# Patient Record
Sex: Female | Born: 1955 | Race: White | Hispanic: No | Smoking: Former smoker
Health system: Southern US, Community
[De-identification: ages and names within clinical notes are randomized; demographics above are authoritative.]

## PROBLEM LIST (undated history)

## (undated) DIAGNOSIS — K219 Gastro-esophageal reflux disease without esophagitis: Secondary | ICD-10-CM

## (undated) DIAGNOSIS — E785 Hyperlipidemia, unspecified: Secondary | ICD-10-CM

## (undated) DIAGNOSIS — T7840XA Allergy, unspecified, initial encounter: Secondary | ICD-10-CM

## (undated) DIAGNOSIS — H269 Unspecified cataract: Secondary | ICD-10-CM

## (undated) DIAGNOSIS — B019 Varicella without complication: Secondary | ICD-10-CM

## (undated) HISTORY — DX: Allergy, unspecified, initial encounter: T78.40XA

## (undated) HISTORY — DX: Varicella without complication: B01.9

## (undated) HISTORY — DX: Hyperlipidemia, unspecified: E78.5

## (undated) HISTORY — PX: APPENDECTOMY: SHX54

## (undated) HISTORY — PX: TUBAL LIGATION: SHX77

## (undated) HISTORY — PX: TONSILLECTOMY: SUR1361

---

## 2008-10-18 ENCOUNTER — Emergency Department (HOSPITAL_COMMUNITY): Admission: EM | Admit: 2008-10-18 | Discharge: 2008-10-18 | Payer: Self-pay | Admitting: Emergency Medicine

## 2009-10-03 IMAGING — CR DG FOOT COMPLETE 3+V*R*
3 series · 3 of 3 positions shown · non-contrast
Comparison: None

CLINICAL DATA: Right foot injury

RIGHT FOOT COMPLETE - 3+ VIEW

[view not recorded (1 of 3)]
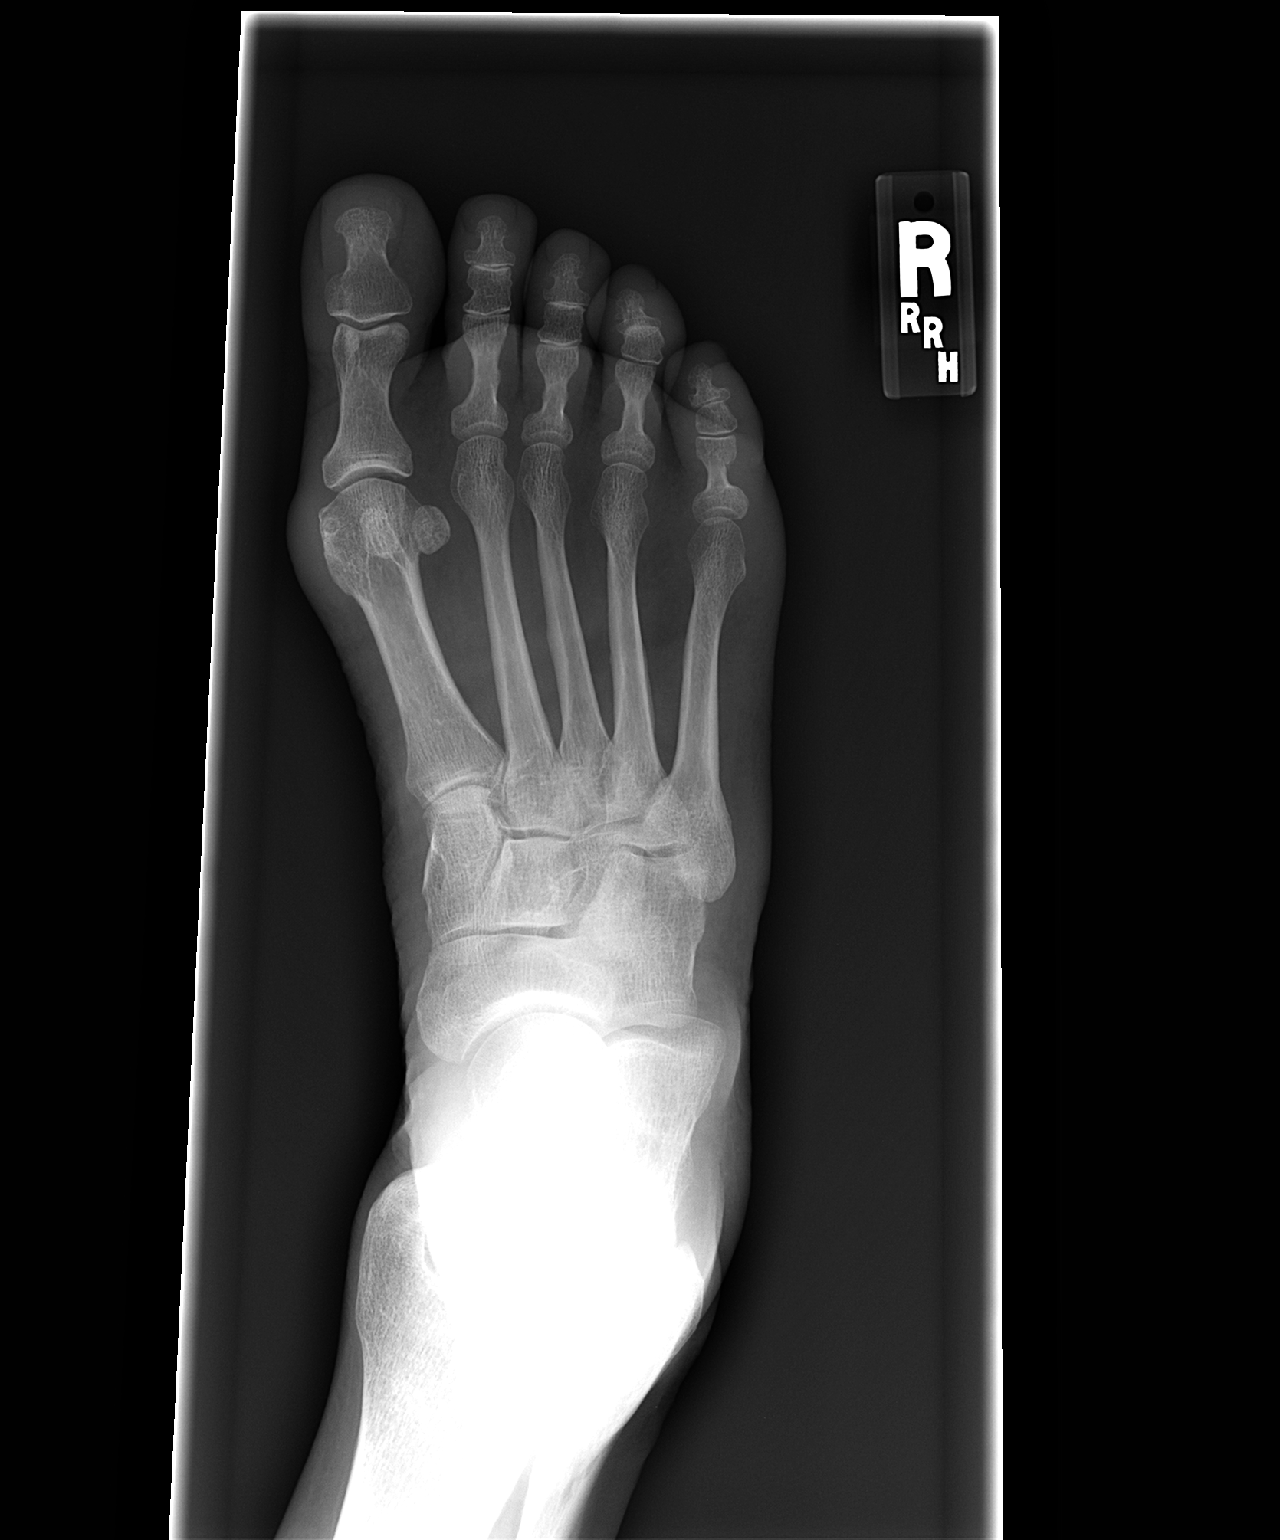

[view not recorded (2 of 3)]
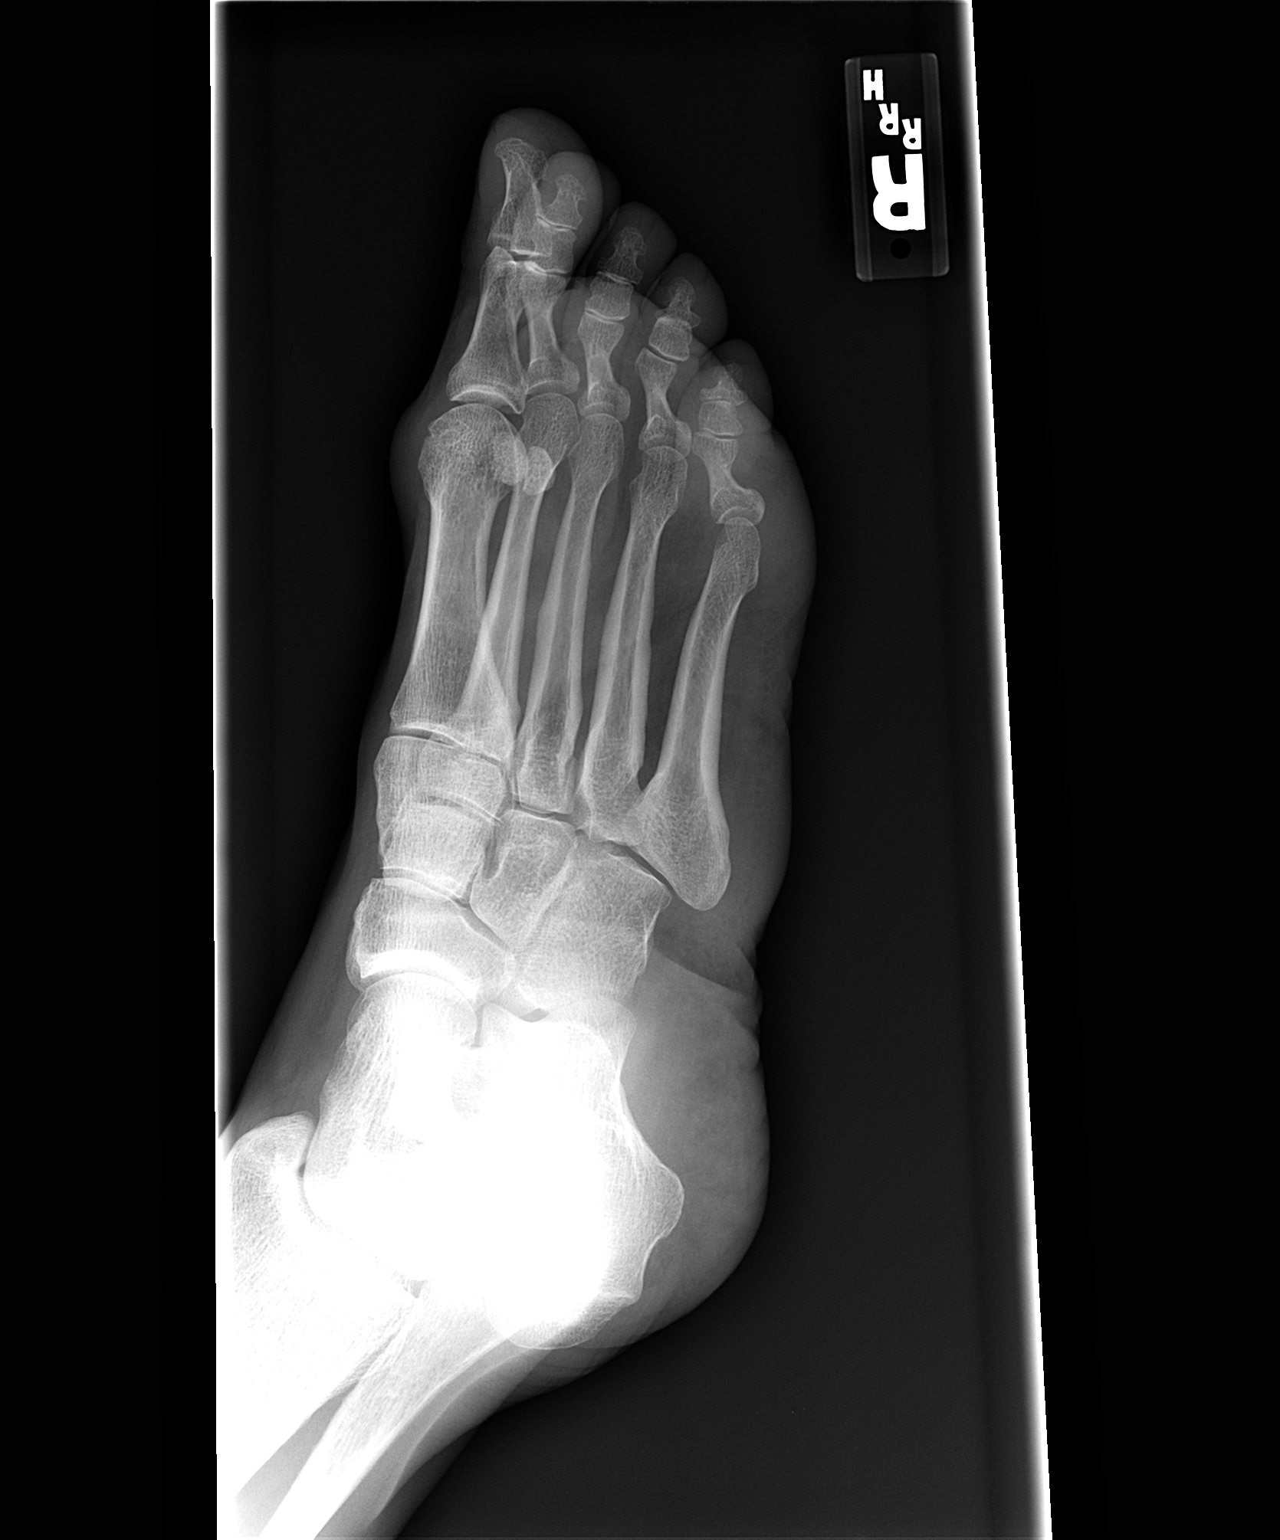

[view not recorded (3 of 3)]
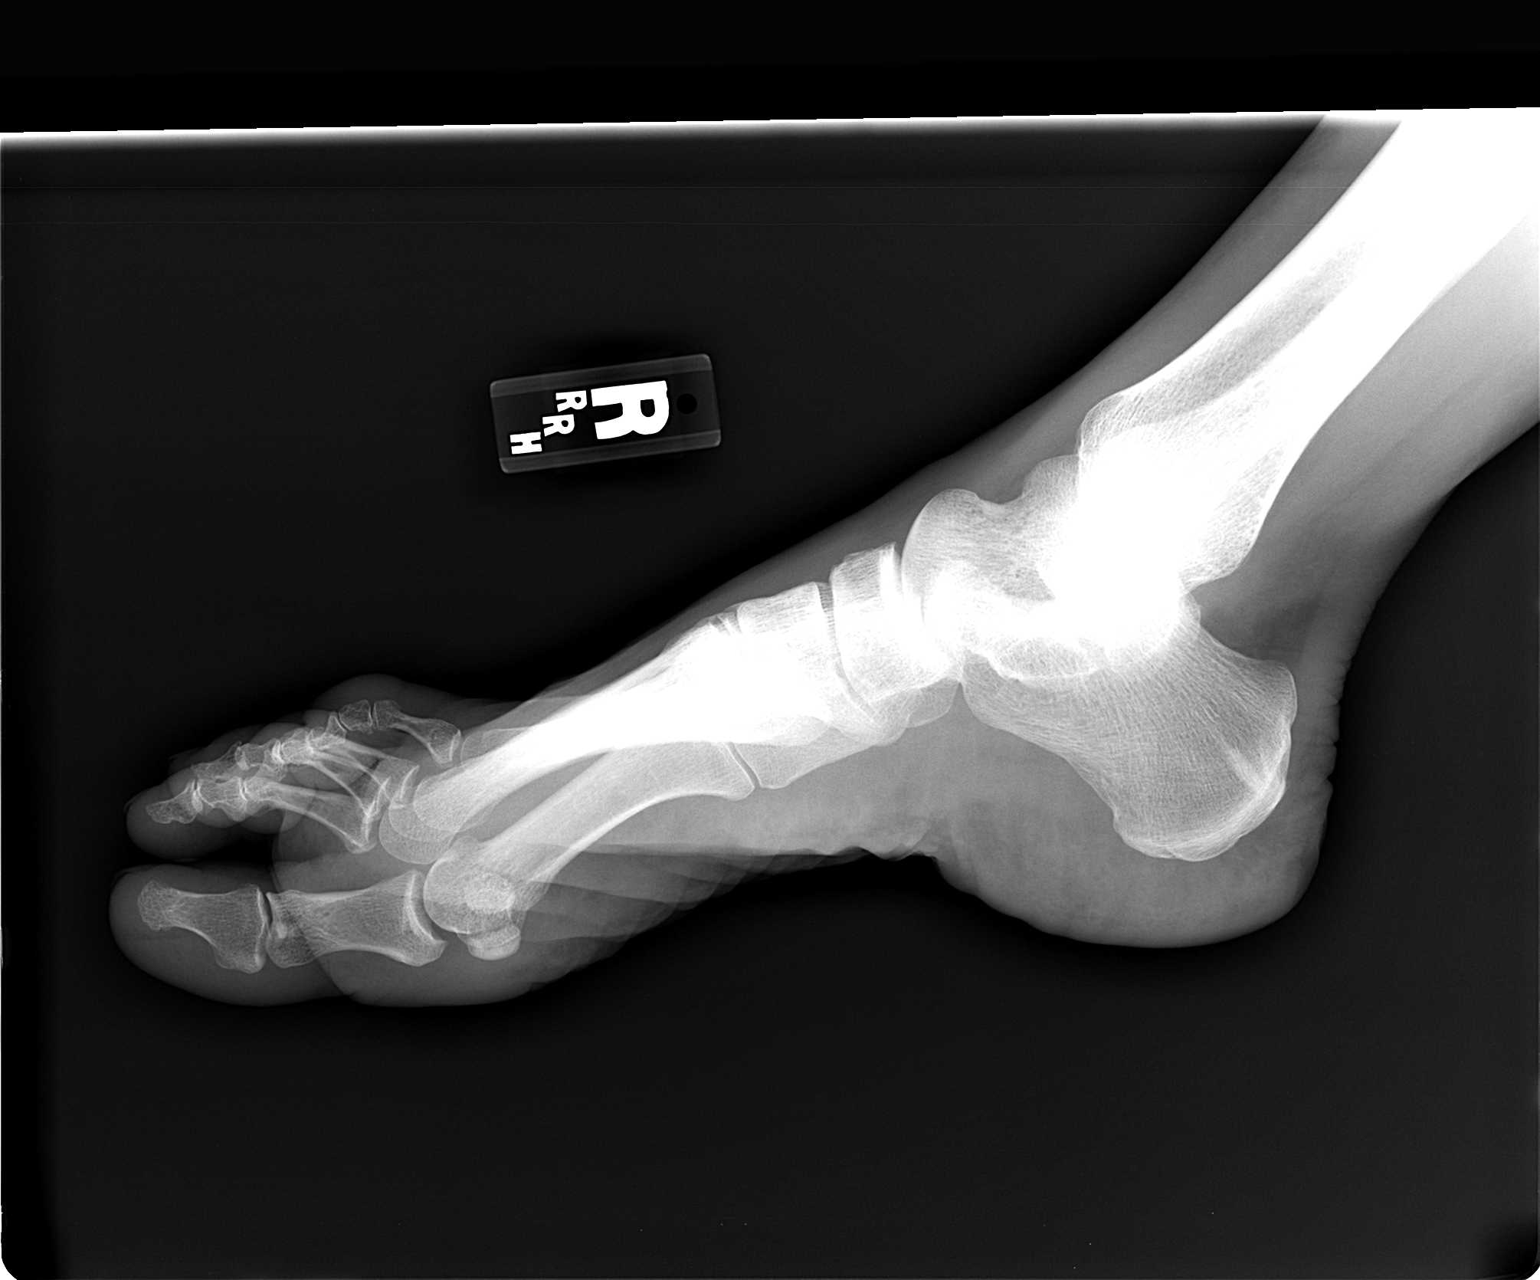

[3 of 3 positions shown; findings below may reference images not displayed]

FINDINGS: Three views submitted.  No acute fracture or subluxation.
No radiopaque foreign body.
IMPRESSION: No acute fracture or subluxation.

## 2013-09-01 ENCOUNTER — Encounter (HOSPITAL_COMMUNITY): Payer: Self-pay | Admitting: Emergency Medicine

## 2013-09-01 ENCOUNTER — Emergency Department (HOSPITAL_COMMUNITY)
Admission: EM | Admit: 2013-09-01 | Discharge: 2013-09-01 | Disposition: A | Discharge status: Home / Self Care | Payer: BC Managed Care – PPO | Source: Home / Self Care | Attending: Family Medicine | Admitting: Family Medicine

## 2013-09-01 DIAGNOSIS — R0982 Postnasal drip: Secondary | ICD-10-CM

## 2013-09-01 DIAGNOSIS — R059 Cough, unspecified: Secondary | ICD-10-CM

## 2013-09-01 DIAGNOSIS — R05 Cough: Secondary | ICD-10-CM

## 2013-09-01 DIAGNOSIS — J069 Acute upper respiratory infection, unspecified: Secondary | ICD-10-CM

## 2013-09-01 HISTORY — DX: Unspecified cataract: H26.9

## 2013-09-01 HISTORY — DX: Gastro-esophageal reflux disease without esophagitis: K21.9

## 2013-09-01 NOTE — ED Provider Notes (Signed)
CSN: [id number]     Arrival date & time 09/01/13  [id number] History   First MD Initiated Contact with Patient 09/01/13 1018     Chief Complaint  Patient presents with  . URI   (Consider location/radiation/quality/duration/timing/severity/associated sxs/prior Treatment) HPI Comments: 58 year old female complaining of nasal discharge, PND and cough for nearly a week. Siting any better she has taken some OTC medication.  Patient is a 58 y.o. female presenting with URI.  URI Presenting symptoms: congestion, cough, rhinorrhea and sore throat   Presenting symptoms: no fever   Associated symptoms: no neck pain and no wheezing     Past Medical History  Diagnosis Date  . Cataract   . GERD (gastroesophageal reflux disease)    No past surgical history on file. History reviewed. No pertinent family history. History  Substance Use Topics  . Smoking status: Never Smoker   . Smokeless tobacco: Not on file  . Alcohol Use: No   OB History   Grav Para Term Preterm Abortions TAB SAB Ect Mult Living                 Review of Systems  Constitutional: Negative for fever, activity change and appetite change.  HENT: Positive for congestion, postnasal drip, rhinorrhea and sore throat. Negative for facial swelling.   Eyes: Negative.   Respiratory: Positive for cough. Negative for shortness of breath and wheezing.   Cardiovascular: Negative.   Gastrointestinal: Negative.   Musculoskeletal: Negative for neck pain and neck stiffness.  Skin: Negative for pallor and rash.  Neurological: Negative.     Allergies  Codeine and Latex  Home Medications   Current Outpatient Rx  Name  Route  Sig  Dispense  Refill  . Multiple Vitamin (MULTIVITAMIN) tablet   Oral   Take 1 tablet by mouth daily.         . ranitidine (ZANTAC) 150 MG tablet   Oral   Take 150 mg by mouth 2 (two) times daily.          BP 130/63  Pulse 69  Temp(Src) 98.9 F (37.2 C) (Oral)  Resp 12  SpO2 98% Physical Exam   Nursing note and vitals reviewed. Constitutional: She is oriented to person, place, and time. She appears well-developed and well-nourished. No distress.  HENT:  Mouth/Throat: No oropharyngeal exudate.  Bilateral TMs are normal Oropharynx with copious amount of clear to slightly opaque PND, mild erythema, no swelling.  Eyes: Conjunctivae and EOM are normal.  Neck: Normal range of motion. Neck supple.  Cardiovascular: Normal rate, regular rhythm and normal heart sounds.   Pulmonary/Chest: Effort normal and breath sounds normal. No respiratory distress. She has no wheezes. She has no rales.  Musculoskeletal: Normal range of motion. She exhibits no edema.  Lymphadenopathy:    She has no cervical adenopathy.  Neurological: She is alert and oriented to person, place, and time.  Skin: Skin is warm and dry. No rash noted.  Psychiatric: She has a normal mood and affect.    ED Course  Procedures (including critical care time) Labs Review Labs Reviewed - No data to display Imaging Review No results found.   MDM   1. URI (upper respiratory infection)   2. PND (post-nasal drip)   3. Cough      Alka Seltzer cold Plus Night Robitussin DM Fluids  Hayden Rasmussenavid Regine Christian, NP 09/01/13 1126

## 2013-09-01 NOTE — ED Provider Notes (Signed)
Medical screening examination/treatment/procedure(s) were performed by a resident physician or non-physician practitioner and as the supervising physician I was immediately available for consultation/collaboration.  Clementeen GrahamEvan Geneveive Furness, MD    Rodolph BongEvan S Seabron Iannello, MD 09/01/13 2113

## 2013-09-01 NOTE — Discharge Instructions (Signed)
Cough, Adult Alka Seltzer Cold Plus and RObitussin DM  A cough is a reflex that helps clear your throat and airways. It can help heal the body or may be a reaction to an irritated airway. A cough may only last 2 or 3 weeks (acute) or may last more than 8 weeks (chronic).  CAUSES Acute cough:  Viral or bacterial infections. Chronic cough:  Infections.  Allergies.  Asthma.  Post-nasal drip.  Smoking.  Heartburn or acid reflux.  Some medicines.  Chronic lung problems (COPD).  Cancer. SYMPTOMS   Cough.  Fever.  Chest pain.  Increased breathing rate.  High-pitched whistling sound when breathing (wheezing).  Colored mucus that you cough up (sputum). TREATMENT   A bacterial cough may be treated with antibiotic medicine.  A viral cough must run its course and will not respond to antibiotics.  Your caregiver may recommend other treatments if you have a chronic cough. HOME CARE INSTRUCTIONS   Only take over-the-counter or prescription medicines for pain, discomfort, or fever as directed by your caregiver. Use cough suppressants only as directed by your caregiver.  Use a cold steam vaporizer or humidifier in your bedroom or home to help loosen secretions.  Sleep in a semi-upright position if your cough is worse at night.  Rest as needed.  Stop smoking if you smoke. SEEK IMMEDIATE MEDICAL CARE IF:   You have pus in your sputum.  Your cough starts to worsen.  You cannot control your cough with suppressants and are losing sleep.  You begin coughing up blood.  You have difficulty breathing.  You develop pain which is getting worse or is uncontrolled with medicine.  You have a fever. MAKE SURE YOU:   Understand these instructions.  Will watch your condition.  Will get help right away if you are not doing well or get worse. Document Released: 12/07/2010 Document Revised: 09/02/2011 Document Reviewed: 12/07/2010 [employer] Patient Information 2014  [place], [place].  Upper Respiratory Infection, Adult An upper respiratory infection (URI) is also sometimes known as the common cold. The upper respiratory tract includes the nose, sinuses, throat, trachea, and bronchi. Bronchi are the airways leading to the lungs. Most people improve within 1 week, but symptoms can last up to 2 weeks. A residual cough may last even longer.  CAUSES Many different viruses can infect the tissues lining the upper respiratory tract. The tissues become irritated and inflamed and often become very moist. Mucus production is also common. A cold is contagious. You can easily spread the virus to others by oral contact. This includes kissing, sharing a glass, coughing, or sneezing. Touching your mouth or nose and then touching a surface, which is then touched by another person, can also spread the virus. SYMPTOMS  Symptoms typically develop 1 to 3 days after you come in contact with a cold virus. Symptoms vary from person to person. They may include:  Runny nose.  Sneezing.  Nasal congestion.  Sinus irritation.  Sore throat.  Loss of voice (laryngitis).  Cough.  Fatigue.  Muscle aches.  Loss of appetite.  Headache.  Low-grade fever. DIAGNOSIS  You might diagnose your own cold based on familiar symptoms, since most people get a cold 2 to 3 times a year. Your caregiver can confirm this based on your exam. Most importantly, your caregiver can check that your symptoms are not due to another disease such as strep throat, sinusitis, pneumonia, asthma, or epiglottitis. Blood tests, throat tests, and X-rays are not necessary to diagnose a common  cold, but they may sometimes be helpful in excluding other more serious diseases. Your caregiver will decide if any further tests are required. RISKS AND COMPLICATIONS  You may be at risk for a more severe case of the common cold if you smoke cigarettes, have chronic heart disease (such as heart failure) or lung disease (such  as asthma), or if you have a weakened immune system. The very young and very old are also at risk for more serious infections. Bacterial sinusitis, middle ear infections, and bacterial pneumonia can complicate the common cold. The common cold can worsen asthma and chronic obstructive pulmonary disease (COPD). Sometimes, these complications can require emergency medical care and may be life-threatening. PREVENTION  The best way to protect against getting a cold is to practice good hygiene. Avoid oral or hand contact with people with cold symptoms. Wash your hands often if contact occurs. There is no clear evidence that vitamin C, vitamin E, echinacea, or exercise reduces the chance of developing a cold. However, it is always recommended to get plenty of rest and practice good nutrition. TREATMENT  Treatment is directed at relieving symptoms. There is no cure. Antibiotics are not effective, because the infection is caused by a virus, not by bacteria. Treatment may include:  Increased fluid intake. Sports drinks offer valuable electrolytes, sugars, and fluids.  Breathing heated mist or steam (vaporizer or shower).  Eating chicken soup or other clear broths, and maintaining good nutrition.  Getting plenty of rest.  Using gargles or lozenges for comfort.  Controlling fevers with ibuprofen or acetaminophen as directed by your caregiver.  Increasing usage of your inhaler if you have asthma. Zinc gel and zinc lozenges, taken in the first 24 hours of the common cold, can shorten the duration and lessen the severity of symptoms. Pain medicines may help with fever, muscle aches, and throat pain. A variety of non-prescription medicines are available to treat congestion and runny nose. Your caregiver can make recommendations and may suggest nasal or lung inhalers for other symptoms.  HOME CARE INSTRUCTIONS   Only take over-the-counter or prescription medicines for pain, discomfort, or fever as directed by  your caregiver.  Use a warm mist humidifier or inhale steam from a shower to increase air moisture. This may keep secretions moist and make it easier to breathe.  Drink enough water and fluids to keep your urine clear or pale yellow.  Rest as needed.  Return to work when your temperature has returned to normal or as your caregiver advises. You may need to stay home longer to avoid infecting others. You can also use a face mask and careful hand washing to prevent spread of the virus. SEEK MEDICAL CARE IF:   After the first few days, you feel you are getting worse rather than better.  You need your caregiver's advice about medicines to control symptoms.  You develop chills, worsening shortness of breath, or brown or red sputum. These may be signs of pneumonia.  You develop yellow or brown nasal discharge or pain in the face, especially when you bend forward. These may be signs of sinusitis.  You develop a fever, swollen neck glands, pain with swallowing, or white areas in the back of your throat. These may be signs of strep throat. SEEK IMMEDIATE MEDICAL CARE IF:   You have a fever.  You develop severe or persistent headache, ear pain, sinus pain, or chest pain.  You develop wheezing, a prolonged cough, cough up blood, or have a change in  your usual mucus (if you have chronic lung disease).  You develop sore muscles or a stiff neck. Document Released: 12/04/2000 Document Revised: 09/02/2011 Document Reviewed: 10/12/2010 [facility] Patient Information 2014 [place], [place].  Antibiotic Nonuse  Your caregiver felt that the infection or problem was not one that would be helped with an antibiotic. Infections may be caused by viruses or bacteria. Only a caregiver can tell which one of these is the likely cause of an illness. A cold is the most common cause of infection in both adults and children. A cold is a virus. Antibiotic treatment will have no effect on a viral infection. Viruses can  lead to many lost days of work caring for sick children and many missed days of school. Children may catch as many as 10 "colds" or "flus" per year during which they can be tearful, cranky, and uncomfortable. The goal of treating a virus is aimed at keeping the ill person comfortable. Antibiotics are medications used to help the body fight bacterial infections. There are relatively few types of bacteria that cause infections but there are hundreds of viruses. While both viruses and bacteria cause infection they are very different types of germs. A viral infection will typically go away by itself within 7 to 10 days. Bacterial infections may spread or get worse without antibiotic treatment. Examples of bacterial infections are:  Sore throats (like strep throat or tonsillitis).  Infection in the lung (pneumonia).  Ear and skin infections. Examples of viral infections are:  Colds or flus.  Most coughs and bronchitis.  Sore throats not caused by Strep.  Runny noses. It is often best not to take an antibiotic when a viral infection is the cause of the problem. Antibiotics can kill off the helpful bacteria that we have inside our body and allow harmful bacteria to start growing. Antibiotics can cause side effects such as allergies, nausea, and diarrhea without helping to improve the symptoms of the viral infection. Additionally, repeated uses of antibiotics can cause bacteria inside of our body to become resistant. That resistance can be passed onto harmful bacterial. The next time you have an infection it may be harder to treat if antibiotics are used when they are not needed. Not treating with antibiotics allows our own immune system to develop and take care of infections more efficiently. Also, antibiotics will work better for [place] when they are prescribed for bacterial infections. Treatments for a child that is ill may include:  Give extra fluids throughout the day to stay hydrated.  Get plenty of  rest.  Only give your child over-the-counter or prescription medicines for pain, discomfort, or fever as directed by your caregiver.  The use of a cool mist humidifier may help stuffy noses.  Cold medications if suggested by your caregiver. Your caregiver may decide to start you on an antibiotic if:  The problem you were seen for today continues for a longer length of time than expected.  You develop a secondary bacterial infection. SEEK MEDICAL CARE IF:  Fever lasts longer than 5 days.  Symptoms continue to get worse after 5 to 7 days or become severe.  Difficulty in breathing develops.  Signs of dehydration develop (poor drinking, rare urinating, dark colored urine).  Changes in behavior or worsening tiredness (listlessness or lethargy). Document Released: 08/19/2001 Document Revised: 09/02/2011 Document Reviewed: 02/15/2009 [facility] Patient Information 2014 [place], [place].

## 2013-09-01 NOTE — ED Notes (Signed)
Pt  Reports  Symptoms  Of  Congestion   Cough  With  Nasal  Drainage         Body  Aces  And  Fever     For  About 1  Week         Pt  Is  Sitting  Upright on  Exam table  Speaking in  Complete  sentances  And  Appears  In no  Severe  Distress

## 2013-12-06 ENCOUNTER — Telehealth: Payer: Self-pay

## 2013-12-06 NOTE — Telephone Encounter (Signed)
No answer.  Unable to leave voicemail.  Voice mail box not set up.

## 2013-12-07 ENCOUNTER — Telehealth: Payer: Self-pay | Admitting: *Deleted

## 2013-12-07 ENCOUNTER — Encounter: Payer: Self-pay | Admitting: Family Medicine

## 2013-12-07 ENCOUNTER — Ambulatory Visit (INDEPENDENT_AMBULATORY_CARE_PROVIDER_SITE_OTHER): Payer: BC Managed Care – PPO | Admitting: Family Medicine

## 2013-12-07 VITALS — BP 126/74 | HR 73 | Temp 98.2°F | Ht 65.75 in | Wt 185.4 lb

## 2013-12-07 DIAGNOSIS — E2839 Other primary ovarian failure: Secondary | ICD-10-CM

## 2013-12-07 DIAGNOSIS — R5383 Other fatigue: Principal | ICD-10-CM

## 2013-12-07 DIAGNOSIS — Z0000 Encounter for general adult medical examination without abnormal findings: Secondary | ICD-10-CM

## 2013-12-07 DIAGNOSIS — R5381 Other malaise: Secondary | ICD-10-CM

## 2013-12-07 DIAGNOSIS — K219 Gastro-esophageal reflux disease without esophagitis: Secondary | ICD-10-CM

## 2013-12-07 DIAGNOSIS — Z1231 Encounter for screening mammogram for malignant neoplasm of breast: Secondary | ICD-10-CM

## 2013-12-07 DIAGNOSIS — E669 Obesity, unspecified: Secondary | ICD-10-CM | POA: Insufficient documentation

## 2013-12-07 DIAGNOSIS — Z833 Family history of diabetes mellitus: Secondary | ICD-10-CM

## 2013-12-07 LAB — CBC WITH DIFFERENTIAL/PLATELET
Basophils Absolute: 0 10*3/uL (ref 0.0–0.1)
Basophils Relative: 0.5 % (ref 0.0–3.0)
EOS PCT: 2.2 % (ref 0.0–5.0)
Eosinophils Absolute: 0.2 10*3/uL (ref 0.0–0.7)
HEMATOCRIT: 39.8 % (ref 36.0–46.0)
Hemoglobin: 13.2 g/dL (ref 12.0–15.0)
LYMPHS ABS: 2.2 10*3/uL (ref 0.7–4.0)
Lymphocytes Relative: 29.9 % (ref 12.0–46.0)
MCHC: 33.3 g/dL (ref 30.0–36.0)
MCV: 93 fl (ref 78.0–100.0)
MONO ABS: 0.5 10*3/uL (ref 0.1–1.0)
Monocytes Relative: 6.7 % (ref 3.0–12.0)
Neutro Abs: 4.4 10*3/uL (ref 1.4–7.7)
Neutrophils Relative %: 60.7 % (ref 43.0–77.0)
PLATELETS: 303 10*3/uL (ref 150.0–400.0)
RBC: 4.28 Mil/uL (ref 3.87–5.11)
RDW: 13.1 % (ref 11.5–15.5)
WBC: 7.3 10*3/uL (ref 4.0–10.5)

## 2013-12-07 LAB — BASIC METABOLIC PANEL
BUN: 14 mg/dL (ref 6–23)
CHLORIDE: 106 meq/L (ref 96–112)
CO2: 30 mEq/L (ref 19–32)
Calcium: 9.9 mg/dL (ref 8.4–10.5)
Creatinine, Ser: 1.1 mg/dL (ref 0.4–1.2)
GFR: 55.39 mL/min — ABNORMAL LOW (ref >60.00)
GLUCOSE: 93 mg/dL (ref 70–99)
POTASSIUM: 3.7 meq/L (ref 3.5–5.1)
Sodium: 139 mEq/L (ref 135–145)

## 2013-12-07 LAB — HEPATIC FUNCTION PANEL
ALBUMIN: 4.2 g/dL (ref 3.5–5.2)
ALT: 25 U/L (ref 0–35)
AST: 23 U/L (ref 0–37)
Alkaline Phosphatase: 64 U/L (ref 39–117)
BILIRUBIN TOTAL: 0.2 mg/dL (ref 0.2–1.2)
Bilirubin, Direct: 0 mg/dL (ref 0.0–0.3)
Total Protein: 7.4 g/dL (ref 6.0–8.3)

## 2013-12-07 LAB — T4, FREE: Free T4: 0.73 ng/dL (ref 0.60–1.60)

## 2013-12-07 LAB — VITAMIN B12: Vitamin B-12: 354 pg/mL (ref 211–911)

## 2013-12-07 LAB — LIPID PANEL
CHOL/HDL RATIO: 6
Cholesterol: 265 mg/dL — ABNORMAL HIGH (ref 0–200)
HDL: 43.5 mg/dL (ref >39.00)
LDL CALC: 180 mg/dL — AB (ref 0–99)
NONHDL: 221.5
Triglycerides: 210 mg/dL — ABNORMAL HIGH (ref 0.0–149.0)
VLDL: 42 mg/dL — AB (ref 0.0–40.0)

## 2013-12-07 LAB — T3, FREE: T3, Free: 2.5 pg/mL (ref 2.3–4.2)

## 2013-12-07 LAB — HEMOGLOBIN A1C: HEMOGLOBIN A1C: 5.8 % (ref 4.6–6.5)

## 2013-12-07 LAB — TSH: TSH: 0.6 u[IU]/mL (ref 0.35–4.50)

## 2013-12-07 MED ORDER — ESOMEPRAZOLE MAGNESIUM 40 MG PO CPDR: 40.0000 mg | DELAYED_RELEASE_CAPSULE | Freq: Every day | ORAL | Status: AC

## 2013-12-07 NOTE — Telephone Encounter (Signed)
Caller name:  Kathie RhodesBetty Relation to pt:  self Call back number: [phone]   Pharmacy:  Reason for call:   Pt was seen to estab care this morning.  Wants to make sure she told you correct info on the following:  Last mammo was at [place], [place] In 2011.

## 2013-12-07 NOTE — Progress Notes (Signed)
Subjective:    Patient ID: Olivia Stewart, female    DOB: 08/18/1955, 58 y.o.   MRN: [id number]  HPI Pt here to establish.  She c/o fatigue and weight gain.  50 lbs in 7 years.  She admits to not exercising as much but states her diet has not changed.  She also c/o decrease urination.  She complains of brittle nails , dry hair,  Thinning hair and brows.    She also c/o gerd symptoms and feeling like food gets stuck when she eats.  Zantac helps some but not completely.     Review of Systems As above  Past Medical History  Diagnosis Date  . Cataract   . GERD (gastroesophageal reflux disease)   . Chicken pox   . Hyperlipidemia   . Allergy    History   Social History  . Marital Status: Divorced    Spouse Name: N/A    Number of Children: N/A  . Years of Education: N/A   Occupational History  . [employer]-- nurse     Social History Main Topics  . Smoking status: Former Smoker -- 0.25 packs/day    Types: Cigarettes    Quit date: 06/24/1982  . Smokeless tobacco: Not on file  . Alcohol Use: No  . Drug Use: No  . Sexual Activity: Yes    Partners: Male   Other Topics Concern  . Not on file   Social History Narrative   Exercise--  ellipical about 2x a week   Current Outpatient Prescriptions  Medication Sig Dispense Refill  . Multiple Vitamin (MULTIVITAMIN) tablet Take 1 tablet by mouth daily.      . ranitidine (ZANTAC) 150 MG tablet Take 150 mg by mouth 2 (two) times daily.      Marland Kitchen. esomeprazole (NEXIUM) 40 MG capsule Take 1 capsule (40 mg total) by mouth daily.  90 capsule  3   No current facility-administered medications for this visit.   Allergies  Allergen Reactions  . Codeine   . Latex    Past Surgical History  Procedure Laterality Date  . Tubal ligation    . Appendectomy    . Tonsillectomy         Objective:   Physical Exam  BP 126/74  Pulse 73  Temp(Src) 98.2 F (36.8 C) (Oral)  Ht 5' 5.75" (1.67 m)  Wt 185 lb 6.4 oz (84.097 kg)  BMI  30.15 kg/m2  SpO2 93% General appearance: alert, cooperative, appears stated age and no distress Ears: normal TM's and external ear canals both ears Nose: Nares normal. Septum midline. Mucosa normal. No drainage or sinus tenderness. Throat: lips, mucosa, and tongue normal; teeth and gums normal Neck: no adenopathy, no carotid bruit, no JVD, supple, symmetrical, trachea midline and thyroid not enlarged, symmetric, no tenderness/mass/nodules Lungs: clear to auscultation bilaterally Heart: S1, S2 normal Abdomen: soft, non-tender; bowel sounds normal; no masses,  no organomegaly Extremities: extremities normal, atraumatic, no cyanosis or edema       Assessment & Plan:  1. Other malaise and fatigue Check labs - Basic metabolic panel - CBC with Differential - Hepatic function panel - POCT urinalysis dipstick - TSH - T3, free - T4, free - Hemoglobin A1c - Vitamin B12  2. Family history of diabetes mellitus type II  - Basic metabolic panel - CBC with Differential - Hepatic function panel - POCT urinalysis dipstick - TSH - T3, free - T4, free - Hemoglobin A1c - Vitamin B12  3. Estrogen deficiency  -  DG Bone Density; Future  4. Other screening mammogram  - MM DIGITAL SCREENING BILATERAL; Future  5. Preventative health care  - Lipid panel  6. GERD (gastroesophageal reflux disease) Consider GI if no relief  - esomeprazole (NEXIUM) 40 MG capsule; Take 1 capsule (40 mg total) by mouth daily.  Dispense: 90 capsule; Refill: 3

## 2013-12-07 NOTE — Patient Instructions (Signed)

## 2013-12-07 NOTE — Telephone Encounter (Signed)
Changed it

## 2013-12-07 NOTE — Telephone Encounter (Signed)
Called again this morning.  Still no answer. Still unable to leave a voice message due to voice mail box not being set up.

## 2013-12-07 NOTE — Progress Notes (Signed)
Pre visit review using our clinic review tool, if applicable. No additional management support is needed unless otherwise documented below in the visit note. 

## 2013-12-07 NOTE — Telephone Encounter (Signed)
To MD for review     KP 

## 2013-12-08 LAB — POCT URINALYSIS DIPSTICK
Bilirubin, UA: NEGATIVE
Blood, UA: NEGATIVE
Glucose, UA: NEGATIVE
KETONES UA: NEGATIVE
LEUKOCYTES UA: NEGATIVE
NITRITE UA: NEGATIVE
PROTEIN UA: NEGATIVE
Spec Grav, UA: 1.025
UROBILINOGEN UA: 0.2
pH, UA: 6

## 2013-12-31 ENCOUNTER — Encounter: Payer: Self-pay | Admitting: Family Medicine

## 2014-01-04 ENCOUNTER — Other Ambulatory Visit: Payer: Self-pay

## 2014-01-04 MED ORDER — ALENDRONATE SODIUM 70 MG PO TABS: 70.0000 mg | ORAL_TABLET | ORAL | Status: AC

## 2014-01-12 ENCOUNTER — Encounter: Payer: Self-pay | Admitting: Family Medicine

## 2014-03-10 ENCOUNTER — Ambulatory Visit (INDEPENDENT_AMBULATORY_CARE_PROVIDER_SITE_OTHER): Payer: BC Managed Care – PPO | Admitting: Family Medicine

## 2014-03-10 ENCOUNTER — Encounter: Payer: Self-pay | Admitting: Family Medicine

## 2014-03-10 ENCOUNTER — Other Ambulatory Visit (HOSPITAL_COMMUNITY)
Admission: RE | Admit: 2014-03-10 | Discharge: 2014-03-10 | Disposition: A | Discharge status: Home / Self Care | Payer: BC Managed Care – PPO | Source: Ambulatory Visit | Attending: Family Medicine | Admitting: Family Medicine

## 2014-03-10 VITALS — BP 118/73 | HR 74 | Temp 98.5°F | Ht 66.0 in | Wt 173.9 lb

## 2014-03-10 DIAGNOSIS — Z0000 Encounter for general adult medical examination without abnormal findings: Secondary | ICD-10-CM

## 2014-03-10 DIAGNOSIS — E785 Hyperlipidemia, unspecified: Secondary | ICD-10-CM

## 2014-03-10 DIAGNOSIS — Z124 Encounter for screening for malignant neoplasm of cervix: Secondary | ICD-10-CM

## 2014-03-10 DIAGNOSIS — M899 Disorder of bone, unspecified: Secondary | ICD-10-CM

## 2014-03-10 DIAGNOSIS — M8580 Other specified disorders of bone density and structure, unspecified site: Secondary | ICD-10-CM

## 2014-03-10 DIAGNOSIS — Z1151 Encounter for screening for human papillomavirus (HPV): Secondary | ICD-10-CM | POA: Insufficient documentation

## 2014-03-10 DIAGNOSIS — Z01419 Encounter for gynecological examination (general) (routine) without abnormal findings: Secondary | ICD-10-CM | POA: Diagnosis not present

## 2014-03-10 DIAGNOSIS — Z23 Encounter for immunization: Secondary | ICD-10-CM

## 2014-03-10 DIAGNOSIS — L299 Pruritus, unspecified: Secondary | ICD-10-CM

## 2014-03-10 DIAGNOSIS — M949 Disorder of cartilage, unspecified: Secondary | ICD-10-CM

## 2014-03-10 DIAGNOSIS — M654 Radial styloid tenosynovitis [de Quervain]: Secondary | ICD-10-CM

## 2014-03-10 DIAGNOSIS — K219 Gastro-esophageal reflux disease without esophagitis: Secondary | ICD-10-CM

## 2014-03-10 LAB — HEPATIC FUNCTION PANEL
ALK PHOS: 71 U/L (ref 39–117)
ALT: 29 U/L (ref 0–35)
AST: 26 U/L (ref 0–37)
Albumin: 4.2 g/dL (ref 3.5–5.2)
BILIRUBIN DIRECT: 0 mg/dL (ref 0.0–0.3)
BILIRUBIN TOTAL: 0.3 mg/dL (ref 0.2–1.2)
TOTAL PROTEIN: 7.7 g/dL (ref 6.0–8.3)

## 2014-03-10 LAB — CBC WITH DIFFERENTIAL/PLATELET
BASOS PCT: 0.6 % (ref 0.0–3.0)
Basophils Absolute: 0 10*3/uL (ref 0.0–0.1)
EOS PCT: 1.5 % (ref 0.0–5.0)
Eosinophils Absolute: 0.1 10*3/uL (ref 0.0–0.7)
HCT: 40.5 % (ref 36.0–46.0)
Hemoglobin: 13.4 g/dL (ref 12.0–15.0)
LYMPHS PCT: 31.8 % (ref 12.0–46.0)
Lymphs Abs: 2 10*3/uL (ref 0.7–4.0)
MCHC: 33.1 g/dL (ref 30.0–36.0)
MCV: 92.9 fl (ref 78.0–100.0)
Monocytes Absolute: 0.6 10*3/uL (ref 0.1–1.0)
Monocytes Relative: 9.6 % (ref 3.0–12.0)
NEUTROS PCT: 56.5 % (ref 43.0–77.0)
Neutro Abs: 3.6 10*3/uL (ref 1.4–7.7)
PLATELETS: 281 10*3/uL (ref 150.0–400.0)
RBC: 4.36 Mil/uL (ref 3.87–5.11)
RDW: 12.9 % (ref 11.5–15.5)
WBC: 6.4 10*3/uL (ref 4.0–10.5)

## 2014-03-10 LAB — LIPID PANEL
CHOL/HDL RATIO: 6
Cholesterol: 288 mg/dL — ABNORMAL HIGH (ref 0–200)
HDL: 46.8 mg/dL (ref >39.00)
LDL CALC: 209 mg/dL — AB (ref 0–99)
NONHDL: 241.2
Triglycerides: 159 mg/dL — ABNORMAL HIGH (ref 0.0–149.0)
VLDL: 31.8 mg/dL (ref 0.0–40.0)

## 2014-03-10 LAB — BASIC METABOLIC PANEL
BUN: 15 mg/dL (ref 6–23)
CALCIUM: 9.7 mg/dL (ref 8.4–10.5)
CO2: 29 mEq/L (ref 19–32)
CREATININE: 1 mg/dL (ref 0.4–1.2)
Chloride: 103 mEq/L (ref 96–112)
GFR: 58.46 mL/min — AB (ref >60.00)
Glucose, Bld: 85 mg/dL (ref 70–99)
Potassium: 3.2 mEq/L — ABNORMAL LOW (ref 3.5–5.1)
Sodium: 141 mEq/L (ref 135–145)

## 2014-03-10 LAB — POCT URINALYSIS DIPSTICK
Bilirubin, UA: NEGATIVE
Ketones, UA: NEGATIVE
Leukocytes, UA: NEGATIVE
NITRITE UA: NEGATIVE
PROTEIN UA: 0.15
RBC UA: NEGATIVE
UROBILINOGEN UA: 0.2
pH, UA: 5.5

## 2014-03-10 LAB — TSH: TSH: 0.94 u[IU]/mL (ref 0.35–4.50)

## 2014-03-10 MED ORDER — FISH OIL 1000 MG PO CAPS: ORAL_CAPSULE | ORAL | Status: AC

## 2014-03-10 NOTE — Progress Notes (Signed)
Pre visit review using our clinic review tool, if applicable. No additional management support is needed unless otherwise documented below in the visit note. 

## 2014-03-10 NOTE — Patient Instructions (Signed)
Preventive Care for Adults A healthy lifestyle and preventive care can promote health and wellness. Preventive health guidelines for women include the following key practices.  A routine yearly physical is a good way to check with your health care provider about your health and preventive screening. It is a chance to share any concerns and updates on your health and to receive a thorough exam.  Visit your dentist for a routine exam and preventive care every 6 months. Brush your teeth twice a day and floss once a day. Good oral hygiene prevents tooth decay and gum disease.  The frequency of eye exams is based on your age, health, family medical history, use of contact lenses, and other factors. Follow your health care provider's recommendations for frequency of eye exams.  Eat a healthy diet. Foods like vegetables, fruits, whole grains, low-fat dairy products, and lean protein foods contain the nutrients you need without too many calories. Decrease your intake of foods high in solid fats, added sugars, and salt. Eat the right amount of calories for you.Get information about a proper diet from your health care provider, if necessary.  Regular physical exercise is one of the most important things you can do for your health. Most adults should get at least 150 minutes of moderate-intensity exercise (any activity that increases your heart rate and causes you to sweat) each week. In addition, most adults need muscle-strengthening exercises on 2 or more days a week.  Maintain a healthy weight. The body mass index (BMI) is a screening tool to identify possible weight problems. It provides an estimate of body fat based on height and weight. Your health care provider can find your BMI and can help you achieve or maintain a healthy weight.For adults 20 years and older:  A BMI below 18.5 is considered underweight.  A BMI of 18.5 to 24.9 is normal.  A BMI of 25 to 29.9 is considered overweight.  A BMI of  30 and above is considered obese.  Maintain normal blood lipids and cholesterol levels by exercising and minimizing your intake of saturated fat. Eat a balanced diet with plenty of fruit and vegetables. Blood tests for lipids and cholesterol should begin at age 76 and be repeated every 5 years. If your lipid or cholesterol levels are high, you are over 50, or you are at high risk for heart disease, you may need your cholesterol levels checked more frequently.Ongoing high lipid and cholesterol levels should be treated with medicines if diet and exercise are not working.  If you smoke, find out from your health care provider how to quit. If you do not use tobacco, do not start.  Lung cancer screening is recommended for adults aged 22-80 years who are at high risk for developing lung cancer because of a history of smoking. A yearly low-dose CT scan of the lungs is recommended for people who have at least a 30-pack-year history of smoking and are a current smoker or have quit within the past 15 years. A pack year of smoking is smoking an average of 1 pack of cigarettes a day for 1 year (for example: 1 pack a day for 30 years or 2 packs a day for 15 years). Yearly screening should continue until the smoker has stopped smoking for at least 15 years. Yearly screening should be stopped for people who develop a health problem that would prevent them from having lung cancer treatment.  If you are pregnant, do not drink alcohol. If you are breastfeeding,  be very cautious about drinking alcohol. If you are not pregnant and choose to drink alcohol, do not have more than 1 drink per day. One drink is considered to be 12 ounces (355 mL) of beer, 5 ounces (148 mL) of wine, or 1.5 ounces (44 mL) of liquor.  Avoid use of street drugs. Do not share needles with anyone. Ask for help if you need support or instructions about stopping the use of drugs.  High blood pressure causes heart disease and increases the risk of  stroke. Your blood pressure should be checked at least every 1 to 2 years. Ongoing high blood pressure should be treated with medicines if weight loss and exercise do not work.  If you are 3-86 years old, ask your health care provider if you should take aspirin to prevent strokes.  Diabetes screening involves taking a blood sample to check your fasting blood sugar level. This should be done once every 3 years, after age 67, if you are within normal weight and without risk factors for diabetes. Testing should be considered at a younger age or be carried out more frequently if you are overweight and have at least 1 risk factor for diabetes.  Breast cancer screening is essential preventive care for women. You should practice "breast self-awareness." This means understanding the normal appearance and feel of your breasts and may include breast self-examination. Any changes detected, no matter how small, should be reported to a health care provider. Women in their 8s and 30s should have a clinical breast exam (CBE) by a health care provider as part of a regular health exam every 1 to 3 years. After age 70, women should have a CBE every year. Starting at age 25, women should consider having a mammogram (breast X-ray test) every year. Women who have a family history of breast cancer should talk to their health care provider about genetic screening. Women at a high risk of breast cancer should talk to their health care providers about having an MRI and a mammogram every year.  Breast cancer gene (BRCA)-related cancer risk assessment is recommended for women who have family members with BRCA-related cancers. BRCA-related cancers include breast, ovarian, tubal, and peritoneal cancers. Having family members with these cancers may be associated with an increased risk for harmful changes (mutations) in the breast cancer genes BRCA1 and BRCA2. Results of the assessment will determine the need for genetic counseling and  BRCA1 and BRCA2 testing.  Routine pelvic exams to screen for cancer are no longer recommended for nonpregnant women who are considered low risk for cancer of the pelvic organs (ovaries, uterus, and vagina) and who do not have symptoms. Ask your health care provider if a screening pelvic exam is right for you.  If you have had past treatment for cervical cancer or a condition that could lead to cancer, you need Pap tests and screening for cancer for at least 20 years after your treatment. If Pap tests have been discontinued, your risk factors (such as having a new sexual partner) need to be reassessed to determine if screening should be resumed. Some women have medical problems that increase the chance of getting cervical cancer. In these cases, your health care provider may recommend more frequent screening and Pap tests.  The HPV test is an additional test that may be used for cervical cancer screening. The HPV test looks for the virus that can cause the cell changes on the cervix. The cells collected during the Pap test can be  tested for HPV. The HPV test could be used to screen women aged 30 years and older, and should be used in women of any age who have unclear Pap test results. After the age of 30, women should have HPV testing at the same frequency as a Pap test.  Colorectal cancer can be detected and often prevented. Most routine colorectal cancer screening begins at the age of 50 years and continues through age 75 years. However, your health care provider may recommend screening at an earlier age if you have risk factors for colon cancer. On a yearly basis, your health care provider may provide home test kits to check for hidden blood in the stool. Use of a small camera at the end of a tube, to directly examine the colon (sigmoidoscopy or colonoscopy), can detect the earliest forms of colorectal cancer. Talk to your health care provider about this at age 50, when routine screening begins. Direct  exam of the colon should be repeated every 5-10 years through age 75 years, unless early forms of pre-cancerous polyps or small growths are found.  People who are at an increased risk for hepatitis B should be screened for this virus. You are considered at high risk for hepatitis B if:  You were born in a country where hepatitis B occurs often. Talk with your health care provider about which countries are considered high risk.  Your parents were born in a high-risk country and you have not received a shot to protect against hepatitis B (hepatitis B vaccine).  You have HIV or AIDS.  You use needles to inject street drugs.  You live with, or have sex with, someone who has hepatitis B.  You get hemodialysis treatment.  You take certain medicines for conditions like cancer, organ transplantation, and autoimmune conditions.  Hepatitis C blood testing is recommended for all people born from 1945 through 1965 and any individual with known risks for hepatitis C.  Practice safe sex. Use condoms and avoid high-risk sexual practices to reduce the spread of sexually transmitted infections (STIs). STIs include gonorrhea, chlamydia, syphilis, trichomonas, herpes, HPV, and human immunodeficiency virus (HIV). Herpes, HIV, and HPV are viral illnesses that have no cure. They can result in disability, cancer, and death.  You should be screened for sexually transmitted illnesses (STIs) including gonorrhea and chlamydia if:  You are sexually active and are younger than 24 years.  You are older than 24 years and your health care provider tells you that you are at risk for this type of infection.  Your sexual activity has changed since you were last screened and you are at an increased risk for chlamydia or gonorrhea. Ask your health care provider if you are at risk.  If you are at risk of being infected with HIV, it is recommended that you take a prescription medicine daily to prevent HIV infection. This is  called preexposure prophylaxis (PrEP). You are considered at risk if:  You are a heterosexual woman, are sexually active, and are at increased risk for HIV infection.  You take drugs by injection.  You are sexually active with a partner who has HIV.  Talk with your health care provider about whether you are at high risk of being infected with HIV. If you choose to begin PrEP, you should first be tested for HIV. You should then be tested every 3 months for as long as you are taking PrEP.  Osteoporosis is a disease in which the bones lose minerals and strength   with aging. This can result in serious bone fractures or breaks. The risk of osteoporosis can be identified using a bone density scan. Women ages 65 years and over and women at risk for fractures or osteoporosis should discuss screening with their health care providers. Ask your health care provider whether you should take a calcium supplement or vitamin D to reduce the rate of osteoporosis.  Menopause can be associated with physical symptoms and risks. Hormone replacement therapy is available to decrease symptoms and risks. You should talk to your health care provider about whether hormone replacement therapy is right for you.  Use sunscreen. Apply sunscreen liberally and repeatedly throughout the day. You should seek shade when your shadow is shorter than you. Protect yourself by wearing long sleeves, pants, a wide-brimmed hat, and sunglasses year round, whenever you are outdoors.  Once a month, do a whole body skin exam, using a mirror to look at the skin on your back. Tell your health care provider of new moles, moles that have irregular borders, moles that are larger than a pencil eraser, or moles that have changed in shape or color.  Stay current with required vaccines (immunizations).  Influenza vaccine. All adults should be immunized every year.  Tetanus, diphtheria, and acellular pertussis (Td, Tdap) vaccine. Pregnant women should  receive 1 dose of Tdap vaccine during each pregnancy. The dose should be obtained regardless of the length of time since the last dose. Immunization is preferred during the 27th-36th week of gestation. An adult who has not previously received Tdap or who does not know her vaccine status should receive 1 dose of Tdap. This initial dose should be followed by tetanus and diphtheria toxoids (Td) booster doses every 10 years. Adults with an unknown or incomplete history of completing a 3-dose immunization series with Td-containing vaccines should begin or complete a primary immunization series including a Tdap dose. Adults should receive a Td booster every 10 years.  Varicella vaccine. An adult without evidence of immunity to varicella should receive 2 doses or a second dose if she has previously received 1 dose. Pregnant females who do not have evidence of immunity should receive the first dose after pregnancy. This first dose should be obtained before leaving the health care facility. The second dose should be obtained 4-8 weeks after the first dose.  Human papillomavirus (HPV) vaccine. Females aged 13-26 years who have not received the vaccine previously should obtain the 3-dose series. The vaccine is not recommended for use in pregnant females. However, pregnancy testing is not needed before receiving a dose. If a female is found to be pregnant after receiving a dose, no treatment is needed. In that case, the remaining doses should be delayed until after the pregnancy. Immunization is recommended for any person with an immunocompromised condition through the age of 26 years if she did not get any or all doses earlier. During the 3-dose series, the second dose should be obtained 4-8 weeks after the first dose. The third dose should be obtained 24 weeks after the first dose and 16 weeks after the second dose.  Zoster vaccine. One dose is recommended for adults aged 60 years or older unless certain conditions are  present.  Measles, mumps, and rubella (MMR) vaccine. Adults born before 1957 generally are considered immune to measles and mumps. Adults born in 1957 or later should have 1 or more doses of MMR vaccine unless there is a contraindication to the vaccine or there is laboratory evidence of immunity to   each of the three diseases. A routine second dose of MMR vaccine should be obtained at least 28 days after the first dose for students attending postsecondary schools, health care workers, or international travelers. People who received inactivated measles vaccine or an unknown type of measles vaccine during 1963-1967 should receive 2 doses of MMR vaccine. People who received inactivated mumps vaccine or an unknown type of mumps vaccine before 1979 and are at high risk for mumps infection should consider immunization with 2 doses of MMR vaccine. For females of childbearing age, rubella immunity should be determined. If there is no evidence of immunity, females who are not pregnant should be vaccinated. If there is no evidence of immunity, females who are pregnant should delay immunization until after pregnancy. Unvaccinated health care workers born before 1957 who lack laboratory evidence of measles, mumps, or rubella immunity or laboratory confirmation of disease should consider measles and mumps immunization with 2 doses of MMR vaccine or rubella immunization with 1 dose of MMR vaccine.  Pneumococcal 13-valent conjugate (PCV13) vaccine. When indicated, a person who is uncertain of her immunization history and has no record of immunization should receive the PCV13 vaccine. An adult aged 19 years or older who has certain medical conditions and has not been previously immunized should receive 1 dose of PCV13 vaccine. This PCV13 should be followed with a dose of pneumococcal polysaccharide (PPSV23) vaccine. The PPSV23 vaccine dose should be obtained at least 8 weeks after the dose of PCV13 vaccine. An adult aged 19  years or older who has certain medical conditions and previously received 1 or more doses of PPSV23 vaccine should receive 1 dose of PCV13. The PCV13 vaccine dose should be obtained 1 or more years after the last PPSV23 vaccine dose.  Pneumococcal polysaccharide (PPSV23) vaccine. When PCV13 is also indicated, PCV13 should be obtained first. All adults aged 65 years and older should be immunized. An adult younger than age 65 years who has certain medical conditions should be immunized. Any person who resides in a nursing home or long-term care facility should be immunized. An adult smoker should be immunized. People with an immunocompromised condition and certain other conditions should receive both PCV13 and PPSV23 vaccines. People with human immunodeficiency virus (HIV) infection should be immunized as soon as possible after diagnosis. Immunization during chemotherapy or radiation therapy should be avoided. Routine use of PPSV23 vaccine is not recommended for American Indians, [place] Natives, or people younger than 65 years unless there are medical conditions that require PPSV23 vaccine. When indicated, people who have unknown immunization and have no record of immunization should receive PPSV23 vaccine. One-time revaccination 5 years after the first dose of PPSV23 is recommended for people aged 19-64 years who have chronic kidney failure, nephrotic syndrome, asplenia, or immunocompromised conditions. People who received 1-2 doses of PPSV23 before age 65 years should receive another dose of PPSV23 vaccine at age 65 years or later if at least 5 years have passed since the previous dose. Doses of PPSV23 are not needed for people immunized with PPSV23 at or after age 65 years.  Meningococcal vaccine. Adults with asplenia or persistent complement component deficiencies should receive 2 doses of quadrivalent meningococcal conjugate (MenACWY-D) vaccine. The doses should be obtained at least 2 months apart.  Microbiologists working with certain meningococcal bacteria, military recruits, people at risk during an outbreak, and people who travel to or live in countries with a high rate of meningitis should be immunized. A first-year college student up through age   21 years who is living in a residence hall should receive a dose if she did not receive a dose on or after her 16th birthday. Adults who have certain high-risk conditions should receive one or more doses of vaccine.  Hepatitis A vaccine. Adults who wish to be protected from this disease, have certain high-risk conditions, work with hepatitis A-infected animals, work in hepatitis A research labs, or travel to or work in countries with a high rate of hepatitis A should be immunized. Adults who were previously unvaccinated and who anticipate close contact with an international adoptee during the first 60 days after arrival in the [place] from a country with a high rate of hepatitis A should be immunized.  Hepatitis B vaccine. Adults who wish to be protected from this disease, have certain high-risk conditions, may be exposed to blood or other infectious body fluids, are household contacts or sex partners of hepatitis B positive people, are clients or workers in certain care facilities, or travel to or work in countries with a high rate of hepatitis B should be immunized.  Haemophilus influenzae type b (Hib) vaccine. A previously unvaccinated person with asplenia or sickle cell disease or having a scheduled splenectomy should receive 1 dose of Hib vaccine. Regardless of previous immunization, a recipient of a hematopoietic stem cell transplant should receive a 3-dose series 6-12 months after her successful transplant. Hib vaccine is not recommended for adults with HIV infection. Preventive Services / Frequency Ages 64 to 68 years  Blood pressure check.** / Every 1 to 2 years.  Lipid and cholesterol check.** / Every 5 years beginning at age  22.  Clinical breast exam.** / Every 3 years for women in their 88s and 53s.  BRCA-related cancer risk assessment.** / For women who have family members with a BRCA-related cancer (breast, ovarian, tubal, or peritoneal cancers).  Pap test.** / Every 2 years from ages 90 through 51. Every 3 years starting at age 21 through age 56 or 3 with a history of 3 consecutive normal Pap tests.  HPV screening.** / Every 3 years from ages 24 through ages 1 to 46 with a history of 3 consecutive normal Pap tests.  Hepatitis C blood test.** / For any individual with known risks for hepatitis C.  Skin self-exam. / Monthly.  Influenza vaccine. / Every year.  Tetanus, diphtheria, and acellular pertussis (Tdap, Td) vaccine.** / Consult your health care provider. Pregnant women should receive 1 dose of Tdap vaccine during each pregnancy. 1 dose of Td every 10 years.  Varicella vaccine.** / Consult your health care provider. Pregnant females who do not have evidence of immunity should receive the first dose after pregnancy.  HPV vaccine. / 3 doses over 6 months, if 72 and younger. The vaccine is not recommended for use in pregnant females. However, pregnancy testing is not needed before receiving a dose.  Measles, mumps, rubella (MMR) vaccine.** / You need at least 1 dose of MMR if you were born in 1957 or later. You may also need a 2nd dose. For females of childbearing age, rubella immunity should be determined. If there is no evidence of immunity, females who are not pregnant should be vaccinated. If there is no evidence of immunity, females who are pregnant should delay immunization until after pregnancy.  Pneumococcal 13-valent conjugate (PCV13) vaccine.** / Consult your health care provider.  Pneumococcal polysaccharide (PPSV23) vaccine.** / 1 to 2 doses if you smoke cigarettes or if you have certain conditions.  Meningococcal vaccine.** /  1 dose if you are age 19 to 21 years and a first-year college  student living in a residence hall, or have one of several medical conditions, you need to get vaccinated against meningococcal disease. You may also need additional booster doses.  Hepatitis A vaccine.** / Consult your health care provider.  Hepatitis B vaccine.** / Consult your health care provider.  Haemophilus influenzae type b (Hib) vaccine.** / Consult your health care provider. Ages 40 to 64 years  Blood pressure check.** / Every 1 to 2 years.  Lipid and cholesterol check.** / Every 5 years beginning at age 20 years.  Lung cancer screening. / Every year if you are aged 55-80 years and have a 30-pack-year history of smoking and currently smoke or have quit within the past 15 years. Yearly screening is stopped once you have quit smoking for at least 15 years or develop a health problem that would prevent you from having lung cancer treatment.  Clinical breast exam.** / Every year after age 40 years.  BRCA-related cancer risk assessment.** / For women who have family members with a BRCA-related cancer (breast, ovarian, tubal, or peritoneal cancers).  Mammogram.** / Every year beginning at age 40 years and continuing for as long as you are in good health. Consult with your health care provider.  Pap test.** / Every 3 years starting at age 30 years through age 65 or 70 years with a history of 3 consecutive normal Pap tests.  HPV screening.** / Every 3 years from ages 30 years through ages 65 to 70 years with a history of 3 consecutive normal Pap tests.  Fecal occult blood test (FOBT) of stool. / Every year beginning at age 50 years and continuing until age 75 years. You may not need to do this test if you get a colonoscopy every 10 years.  Flexible sigmoidoscopy or colonoscopy.** / Every 5 years for a flexible sigmoidoscopy or every 10 years for a colonoscopy beginning at age 50 years and continuing until age 75 years.  Hepatitis C blood test.** / For all people born from 1945 through  1965 and any individual with known risks for hepatitis C.  Skin self-exam. / Monthly.  Influenza vaccine. / Every year.  Tetanus, diphtheria, and acellular pertussis (Tdap/Td) vaccine.** / Consult your health care provider. Pregnant women should receive 1 dose of Tdap vaccine during each pregnancy. 1 dose of Td every 10 years.  Varicella vaccine.** / Consult your health care provider. Pregnant females who do not have evidence of immunity should receive the first dose after pregnancy.  Zoster vaccine.** / 1 dose for adults aged 60 years or older.  Measles, mumps, rubella (MMR) vaccine.** / You need at least 1 dose of MMR if you were born in 1957 or later. You may also need a 2nd dose. For females of childbearing age, rubella immunity should be determined. If there is no evidence of immunity, females who are not pregnant should be vaccinated. If there is no evidence of immunity, females who are pregnant should delay immunization until after pregnancy.  Pneumococcal 13-valent conjugate (PCV13) vaccine.** / Consult your health care provider.  Pneumococcal polysaccharide (PPSV23) vaccine.** / 1 to 2 doses if you smoke cigarettes or if you have certain conditions.  Meningococcal vaccine.** / Consult your health care provider.  Hepatitis A vaccine.** / Consult your health care provider.  Hepatitis B vaccine.** / Consult your health care provider.  Haemophilus influenzae type b (Hib) vaccine.** / Consult your health care provider. Ages 65   years and over  Blood pressure check.** / Every 1 to 2 years.  Lipid and cholesterol check.** / Every 5 years beginning at age 22 years.  Lung cancer screening. / Every year if you are aged 73-80 years and have a 30-pack-year history of smoking and currently smoke or have quit within the past 15 years. Yearly screening is stopped once you have quit smoking for at least 15 years or develop a health problem that would prevent you from having lung cancer  treatment.  Clinical breast exam.** / Every year after age 4 years.  BRCA-related cancer risk assessment.** / For women who have family members with a BRCA-related cancer (breast, ovarian, tubal, or peritoneal cancers).  Mammogram.** / Every year beginning at age 40 years and continuing for as long as you are in good health. Consult with your health care provider.  Pap test.** / Every 3 years starting at age 9 years through age 34 or 91 years with 3 consecutive normal Pap tests. Testing can be stopped between 65 and 70 years with 3 consecutive normal Pap tests and no abnormal Pap or HPV tests in the past 10 years.  HPV screening.** / Every 3 years from ages 57 years through ages 64 or 45 years with a history of 3 consecutive normal Pap tests. Testing can be stopped between 65 and 70 years with 3 consecutive normal Pap tests and no abnormal Pap or HPV tests in the past 10 years.  Fecal occult blood test (FOBT) of stool. / Every year beginning at age 15 years and continuing until age 17 years. You may not need to do this test if you get a colonoscopy every 10 years.  Flexible sigmoidoscopy or colonoscopy.** / Every 5 years for a flexible sigmoidoscopy or every 10 years for a colonoscopy beginning at age 86 years and continuing until age 71 years.  Hepatitis C blood test.** / For all people born from [birth year] through 1965 and any individual with known risks for hepatitis C.  Osteoporosis screening.** / A one-time screening for women ages 83 years and over and women at risk for fractures or osteoporosis.  Skin self-exam. / Monthly.  Influenza vaccine. / Every year.  Tetanus, diphtheria, and acellular pertussis (Tdap/Td) vaccine.** / 1 dose of Td every 10 years.  Varicella vaccine.** / Consult your health care provider.  Zoster vaccine.** / 1 dose for adults aged 61 years or older.  Pneumococcal 13-valent conjugate (PCV13) vaccine.** / Consult your health care provider.  Pneumococcal  polysaccharide (PPSV23) vaccine.** / 1 dose for all adults aged 28 years and older.  Meningococcal vaccine.** / Consult your health care provider.  Hepatitis A vaccine.** / Consult your health care provider.  Hepatitis B vaccine.** / Consult your health care provider.  Haemophilus influenzae type b (Hib) vaccine.** / Consult your health care provider. ** Family history and personal history of risk and conditions may change your health care provider's recommendations. Document Released: 08/06/2001 Document Revised: 10/25/2013 Document Reviewed: 11/05/2010 [facility] Patient Information 2015 [place], [place]. This information is not intended to replace advice given to you by your health care provider. Make sure you discuss any questions you have with your health care provider.

## 2014-03-10 NOTE — Progress Notes (Signed)
Subjective:     Olivia Stewart is a 58 y.o. female and is here for a comprehensive physical exam. The patient reports problems - itchy skin.  History   Social History  . Marital Status: Divorced    Spouse Name: N/A    Number of Children: N/A  . Years of Education: N/A   Occupational History  . [employer]-- nurse     Social History Main Topics  . Smoking status: Former Smoker -- 0.25 packs/day    Types: Cigarettes    Quit date: 06/24/1982  . Smokeless tobacco: Not on file  . Alcohol Use: No  . Drug Use: No  . Sexual Activity: Yes    Partners: Male   Other Topics Concern  . Not on file   Social History Narrative   Exercise--  ellipical about 2x a week   Health Maintenance  Topic Date Due  . Tetanus/tdap  12/29/1974  . Pap Smear  08/06/2009  . Influenza Vaccine  03/11/2015 (Originally 01/22/2014)  . Mammogram  12/16/2015  . Colonoscopy  01/20/2020    The following portions of the patient's history were reviewed and updated as appropriate:  She  has a past medical history of Cataract; GERD (gastroesophageal reflux disease); Chicken pox; Hyperlipidemia; and Allergy. She  does not have any pertinent problems on file. She  has past surgical history that includes Tubal ligation; Appendectomy; and Tonsillectomy. Her family history includes Alcohol abuse in her paternal grandfather and paternal uncle; Arthritis in her father and mother; Cancer in her maternal aunt, maternal grandfather, and mother; Diabetes in her brother, father, maternal aunt, maternal grandfather, maternal uncle, mother, and paternal aunt; Heart disease in her father and paternal grandfather; Hyperlipidemia in her brother, father, and mother; Hypertension in her brother, father, and mother; Kidney disease in her father; Stroke in her father and paternal grandfather. She  reports that she quit smoking about 31 years ago. Her smoking use included Cigarettes. She smoked 0.25 packs per day. She does not  have any smokeless tobacco history on file. She reports that she does not drink alcohol or use illicit drugs. She has a current medication list which includes the following prescription(s): alendronate, esomeprazole, multivitamin, and ranitidine. Current Outpatient Prescriptions on File Prior to Visit  Medication Sig Dispense Refill  . alendronate (FOSAMAX) 70 MG tablet Take 1 tablet (70 mg total) by mouth once a week. Take with a full glass of water on an empty stomach.  4 tablet  11  . esomeprazole (NEXIUM) 40 MG capsule Take 1 capsule (40 mg total) by mouth daily.  90 capsule  3  . Multiple Vitamin (MULTIVITAMIN) tablet Take 1 tablet by mouth daily.      . ranitidine (ZANTAC) 150 MG tablet Take 150 mg by mouth daily.        No current facility-administered medications on file prior to visit.   She is allergic to codeine and latex..  Review of Systems Review of Systems  Constitutional: Negative for activity change, appetite change and fatigue.  HENT: Negative for hearing loss, congestion, tinnitus and ear discharge.  dentist q55m Eyes: Negative for visual disturbance (see optho q1y -- vision corrected to 20/20 with glasses).  Respiratory: Negative for cough, chest tightness and shortness of breath.   Cardiovascular: Negative for chest pain, palpitations and leg swelling.  Gastrointestinal: Negative for abdominal pain, diarrhea, constipation and abdominal distention.  Genitourinary: Negative for urgency, frequency, decreased urine volume and difficulty urinating.  Musculoskeletal: Negative for back pain, arthralgias and  gait problem.  Skin: Negative for color change, pallor and rash.  Neurological: Negative for dizziness, light-headedness, numbness and headaches.  Hematological: Negative for adenopathy. Does not bruise/bleed easily.  Psychiatric/Behavioral: Negative for suicidal ideas, confusion, sleep disturbance, self-injury, dysphoric mood, decreased concentration and agitation.        Objective:    BP 118/73  Pulse 74  Temp(Src) 98.5 F (36.9 C) (Oral)  Ht  (1.676 m)  Wt 173 lb 15.1 oz (78.9 kg)  BMI 28.09 kg/m2  SpO2 97% General appearance: alert, cooperative, appears stated age and no distress Head: Normocephalic, without obvious abnormality, atraumatic Eyes: negative findings: lids and lashes normal and conjunctivae and sclerae normal Ears: normal TM's and external ear canals both ears Nose: Nares normal. Septum midline. Mucosa normal. No drainage or sinus tenderness. Throat: lips, mucosa, and tongue normal; teeth and gums normal Neck: no adenopathy, no carotid bruit, no JVD, supple, symmetrical, trachea midline and thyroid not enlarged, symmetric, no tenderness/mass/nodules Back: symmetric, no curvature. ROM normal. No CVA tenderness. Lungs: clear to auscultation bilaterally Breasts: normal appearance, no masses or tenderness Heart: regular rate and rhythm, S1, S2 normal, no murmur, click, rub or gallop Abdomen: soft, non-tender; bowel sounds normal; no masses,  no organomegaly Pelvic: cervix normal in appearance, external genitalia normal, no adnexal masses or tenderness, no cervical motion tenderness, rectovaginal septum normal, uterus normal size, shape, and consistency, vagina normal without discharge and pap done Extremities: extremities normal, atraumatic, no cyanosis or edema---- pt c/o pain in thumbs--pip and metacarpal phalangeal joints Pulses: 2+ and symmetric Skin: dry-- no rash Lymph nodes: Cervical, supraclavicular, and axillary nodes normal. Neurologic: Alert and oriented X 3, normal strength and tone. Normal symmetric reflexes. Normal coordination and gait Psych-- no depression, no anxiety      Assessment:    Healthy female exam.      Plan:    ghm utd  Check labs See After Visit Summary for Counseling Recommendations    1. Preventative health care  - Basic metabolic panel - CBC with Differential - Hepatic function  panel - Lipid panel - POCT urinalysis dipstick - TSH  2. Gastroesophageal reflux disease without esophagitis con't meds  3. Osteopenia Last bmd normal  4. Other and unspecified hyperlipidemia Check labs today - Omega-3 Fatty Acids (FISH OIL) 1000 MG CAPS; 1 po bid  Dispense: 60 capsule; Refill: 0  5. Pruritus amilactin  6. De Quervain's tenosynovitis, bilateral Pt will wear splints when she is not at work Tylenol prn She does not wish to do anything else right now

## 2014-03-15 LAB — CYTOLOGY - PAP

## 2014-03-23 ENCOUNTER — Other Ambulatory Visit: Payer: Self-pay

## 2014-03-23 MED ORDER — SIMVASTATIN 20 MG PO TABS: 20.0000 mg | ORAL_TABLET | Freq: Every day | ORAL | Status: DC

## 2014-05-21 ENCOUNTER — Encounter (HOSPITAL_COMMUNITY): Payer: Self-pay | Admitting: Emergency Medicine

## 2014-05-21 ENCOUNTER — Emergency Department (HOSPITAL_COMMUNITY)
Admission: EM | Admit: 2014-05-21 | Discharge: 2014-05-21 | Disposition: A | Discharge status: Home / Self Care | Payer: BC Managed Care – PPO | Source: Home / Self Care | Attending: Emergency Medicine | Admitting: Emergency Medicine

## 2014-05-21 DIAGNOSIS — K047 Periapical abscess without sinus: Secondary | ICD-10-CM

## 2014-05-21 MED ORDER — MELOXICAM 15 MG PO TABS: 15.0000 mg | ORAL_TABLET | Freq: Every day | ORAL | Status: AC

## 2014-05-21 MED ORDER — CLINDAMYCIN HCL 300 MG PO CAPS: 300.0000 mg | ORAL_CAPSULE | Freq: Four times a day (QID) | ORAL | Status: AC

## 2014-05-21 MED ORDER — OXYCODONE-ACETAMINOPHEN 5-325 MG PO TABS: ORAL_TABLET | ORAL | Status: AC

## 2014-05-21 NOTE — Discharge Instructions (Signed)
Look up the [employer]'s [facility] for free dental clinics. Http://www.ncdental.org/ncds/Schedule.asp ° °Get there early and be prepared to wait. [employer] and [employer] have dental hygienist schools that provide low cost routine dental care.  ° °Other resources: °[facility] °[address] °[place], [place] °[phone] ° °Patients with Medicaid: °[facility]                     [facility] °[address]                                [address] °Phone:  [phone]                                                  Phone:  [phone] ° °Dr. Janice Civils °[address] °[phone] ° °If unable to pay or uninsured, contact:  [facility] or [facility] to become qualified for the adult dental clinic. ° °No matter what dental problem you have, it will not get better unless you get good dental care.  If the tooth is not taken care of, your symptoms will come back in time and you will be visiting us again in the Urgent Care Center with a bad toothache.  So, see your dentist as soon as possible.  If you don't have a dentist, we can give you a list of dentists.  Sometimes the most cost effective treatment is removal of the tooth.  This can be done very inexpensively through one of the low cost [facility] Dent[facility] Centers such as the facility on [address] in [place] ([phone]).  The downside to this is that you will have one less tooth and this can effect your ability to chew. ° °Some other things that can be done for a dental infection include the following: ° °· Rinse your mouth out with hot salt water (1/2 tsp of table salt and a pinch of baking soda in 8 oz of hot water).  You can do this every 2 or 3 hours. °· Avoid cold foods, beverages, and cold air.  This will make your symptoms worse. °· Sleep with your head elevated.  Sleeping flat will cause your gums and oral tissues to swell and make them hurt  more.  You can sleep on several pillows.  Even better is to sleep in a recliner with your head higher than your heart. °· For mild to moderate pain, you can take Tylenol, ibuprofen, or Aleve. °· External application of heat by a heating pad, hot water bottle, or hot wet towel can help with pain and speed healing.  You can do this every 2 to 3 hours. Do not fall asleep on a heating pad since this can cause a burn.  °·  °

## 2014-05-21 NOTE — ED Notes (Signed)
Pt states that she had dental pain this morning that woke her up out of her sleep. Pt states that the pain started this morning.

## 2014-05-21 NOTE — ED Provider Notes (Signed)
  Chief Complaint   Dental Pain   History of Present Illness   Olivia Stewart is a 58 year old female who was awakened in the middle night last night with pain in her left jaw. She is not sure whether it's coming from the upper or lower jaw. There is no swelling. She has several crowned teeth in both the upper and lower jaw. There is no swelling of the gingiva, no purulent drainage. She has trouble chewing on that side. She denies any difficulty breathing or swallowing. She has had no swelling of the tongue or the throat. She denies any fever, chills, headache, neck swelling, neck pain, chest pain, or shortness of breath.  Review of Systems   Other than as noted above, the patient denies any of the following symptoms: Systemic:  No fever or chills. ENT:  No headache, ear ache, sore throat, nasal congestion, facial pain, or swelling. Neck:  No adenopathy or neck swelling. Lungs:  No coughing or shortness of breath.  PMFSH   Past medical history, family history, social history, meds, and allergies were reviewed. She is allergic to codeine but is able to take other opioids. She takes Fosamax, Lipitor, Zantac, Nexium. She has GERD, osteopenia, and hypercholesterolemia.  Physical Examination     Vital signs:  BP 129/80 mmHg  Pulse 83  Temp(Src) 98.4 F (36.9 C) (Oral)  Resp 16  SpO2 95% General:  Alert, oriented, in no distress. ENT:  TMs and canals normal.  Nasal mucosa normal. Mouth exam:  Overall, teeth are generally in good repair, but she has multiple capped teeth in both upper and lower jaws. None of the teeth appear to be decayed and none are tender to palpation. There is no swelling of the gingiva and no collection of pus. No swelling of the floor the mouth of the tongue. Pharynx is clear. Neck:  No swelling or adenopathy. Lungs:  Breath sounds clear and equal bilaterally.  No wheezes, rales or rhonchi. Heart:  Regular rhythm.  No gallops or murmers. Skin:  Clear, warm and dry.    Assessment   The encounter diagnosis was Dental abscess.  No evidence of Ludwig's angina.    Plan   1.  Meds:  The following meds were prescribed:   New Prescriptions   CLINDAMYCIN (CLEOCIN) 300 MG CAPSULE    Take 1 capsule (300 mg total) by mouth 4 (four) times daily.   MELOXICAM (MOBIC) 15 MG TABLET    Take 1 tablet (15 mg total) by mouth daily.   OXYCODONE-ACETAMINOPHEN (PERCOCET) 5-325 MG PER TABLET    1 to 2 tablets every 6 hours as needed for pain.    2.  Patient Education/Counseling:  The patient was given appropriate handouts, self care instructions, and instructed in pain control. Suggested sleeping with head of bed elevated and hot salt water mouthwash.   3.  Follow up:  The patient was told to follow up if no better in 3 to 4 days, if becoming worse in any way, and given some red flag symptoms such as difficulty swallowing or breathing which would prompt immediate return.  Follow up with a dentist as soon as posssible.     Reuben Likesavid C Celeste Tavenner, MD 05/21/14 [phone]

## 2014-06-23 ENCOUNTER — Telehealth: Payer: Self-pay | Admitting: Family Medicine

## 2014-06-23 MED ORDER — FLUCONAZOLE 150 MG PO TABS: 150.0000 mg | ORAL_TABLET | Freq: Once | ORAL | Status: AC

## 2014-06-23 NOTE — Telephone Encounter (Signed)
Diflucan 150 mg #2  1 po qd x 1, may repeat in 3 days prn  

## 2014-06-23 NOTE — Telephone Encounter (Signed)
Caller name: Ernst SpellGorman, Camillia J Relation to pt: self  Call back number: [phone] Pharmacy:  [id number] - [place], [place] - [address] [phone] (Phone     Reason for call:  Pt states she got a yeast infection from an antibiotic that her dentist placed her on. Pt requesting you send over diflucan and she is experiencing thrust in her mouth requestnig magic mouth wash. Please advise

## 2014-06-23 NOTE — Telephone Encounter (Signed)
Rx faxed.    KP 

## 2014-08-01 ENCOUNTER — Other Ambulatory Visit: Payer: Self-pay

## 2014-08-01 MED ORDER — SIMVASTATIN 20 MG PO TABS: 20.0000 mg | ORAL_TABLET | Freq: Every day | ORAL | Status: AC

## 2015-03-13 ENCOUNTER — Encounter: Payer: BC Managed Care – PPO | Admitting: Family Medicine
